# Patient Record
Sex: Female | Born: 1980
Health system: Southern US, Community
[De-identification: ages and names within clinical notes are randomized; demographics above are authoritative.]

## PROBLEM LIST (undated history)

## (undated) DIAGNOSIS — E282 Polycystic ovarian syndrome: Secondary | ICD-10-CM

## (undated) DIAGNOSIS — U071 COVID-19: Secondary | ICD-10-CM

---

## 2004-07-27 ENCOUNTER — Emergency Department (HOSPITAL_COMMUNITY): Admission: EM | Admit: 2004-07-27 | Discharge: 2004-07-28 | Payer: Self-pay | Admitting: Emergency Medicine

## 2019-05-02 DIAGNOSIS — F411 Generalized anxiety disorder: Secondary | ICD-10-CM | POA: Insufficient documentation

## 2019-05-02 DIAGNOSIS — E282 Polycystic ovarian syndrome: Secondary | ICD-10-CM | POA: Insufficient documentation

## 2020-05-31 ENCOUNTER — Telehealth: Payer: Self-pay | Admitting: Physician Assistant

## 2020-05-31 ENCOUNTER — Telehealth (HOSPITAL_COMMUNITY): Payer: Self-pay | Admitting: Family

## 2020-05-31 DIAGNOSIS — R69 Illness, unspecified: Secondary | ICD-10-CM

## 2020-05-31 NOTE — Telephone Encounter (Signed)
Called to discuss with Lorine Bears about Covid symptoms and potential candidacy for the use of casirivimab/imdevimab, a combination monoclonal antibody infusion for those with mild to moderate Covid symptoms and at a high risk of hospitalization.     Pt is qualified for this infusion at the infusion center due to co-morbid conditions and/or a member of an at-risk group, however unable to reach patient. Phone did not ring, immediately disconnected.   Novi Calia,NP

## 2020-05-31 NOTE — Telephone Encounter (Signed)
Called to Discuss with patient about Covid symptoms and the use of the monoclonal antibody infusion for those with mild to moderate Covid symptoms and at a high risk of hospitalization.     Pt appears to qualify for this infusion due to co-morbid conditions and/or a member of an at-risk group in accordance with the FDA Emergency Use Authorization.    Unable to reach pt. No voice mail.    

## 2020-06-01 DIAGNOSIS — U071 COVID-19: Secondary | ICD-10-CM | POA: Insufficient documentation

## 2020-06-02 ENCOUNTER — Telehealth: Payer: Self-pay | Admitting: Nurse Practitioner

## 2020-06-02 NOTE — Telephone Encounter (Signed)
Called to Discuss with patient about Covid symptoms and the use of the monoclonal antibody infusion for those with mild to moderate Covid symptoms and at a high risk of hospitalization.     Pt appears to qualify for this infusion due to co-morbid conditions and/or a member of an at-risk group in accordance with the FDA Emergency Use Authorization. BMI>25.    Unable to reach pt by multiple providers in attempt to set-up monoclonal infusion. Initial calls made on 05/21/20. No voicemail. Phone hands up after a few rings.   Willette Alma, NP [facility]  [phone]

## 2020-06-13 ENCOUNTER — Emergency Department: Admit: 2020-06-13 | Discharge status: Absent without leave | Payer: Self-pay

## 2020-06-13 ENCOUNTER — Encounter: Payer: Self-pay | Admitting: Emergency Medicine

## 2020-06-13 ENCOUNTER — Emergency Department (INDEPENDENT_AMBULATORY_CARE_PROVIDER_SITE_OTHER)
Admission: EM | Admit: 2020-06-13 | Discharge: 2020-06-13 | Disposition: A | Discharge status: Home / Self Care | Payer: BC Managed Care – PPO | Source: Home / Self Care

## 2020-06-13 ENCOUNTER — Other Ambulatory Visit: Payer: Self-pay

## 2020-06-13 DIAGNOSIS — L03211 Cellulitis of face: Secondary | ICD-10-CM | POA: Diagnosis not present

## 2020-06-13 DIAGNOSIS — H01001 Unspecified blepharitis right upper eyelid: Secondary | ICD-10-CM

## 2020-06-13 DIAGNOSIS — J0100 Acute maxillary sinusitis, unspecified: Secondary | ICD-10-CM

## 2020-06-13 HISTORY — DX: Polycystic ovarian syndrome: E28.2

## 2020-06-13 MED ORDER — ERYTHROMYCIN 5 MG/GM OP OINT: TOPICAL_OINTMENT | Freq: Four times a day (QID) | OPHTHALMIC | 0 refills | Status: AC

## 2020-06-13 MED ORDER — CLINDAMYCIN HCL 300 MG PO CAPS: 300.0000 mg | ORAL_CAPSULE | Freq: Three times a day (TID) | ORAL | 0 refills | Status: AC

## 2020-06-13 NOTE — Discharge Instructions (Signed)
  Please take antibiotics as prescribed and be sure to complete entire course even if you start to feel better to ensure infection does not come back.  Follow up in 3-4 days if not improving. Call 911 or go to the hospital right away if symptoms worsening- increased facial pain or swelling, eye pain, vision changes, dizziness/severe headache or other new concerning symptoms develop.

## 2020-06-13 NOTE — ED Provider Notes (Signed)
[facility]    CSN: [id number] Arrival date & time: 06/13/20  1504      History   Chief Complaint Chief Complaint  Patient presents with  . Facial Pain    HPI Stacy Jacobs is a 39 y.o. female.   HPI Stacy Jacobs is a 39 y.o. female presenting to UC with c/o intermittent facial swelling with sinus pressure and redness for about 3-4 days.  She was tx with the monoclonal antibody COVID infusion 06/02/20 after being dx with COVID on 05/28/20.  Symptoms of nasal congestion were improving until the last few days. She cannot see her PCP until 14 days past COVID dx, which would be next week. Denies fever, chills, n/v/d. No chest pain or SOB. No eye pain or vision changes.    Past Medical History:  Diagnosis Date  . PCOS (polycystic ovarian syndrome)     There are no problems to display for this patient.   Past Surgical History:  Procedure Laterality Date  . CESAREAN SECTION      OB History   No obstetric history on file.      Home Medications    Prior to Admission medications   Medication Sig Start Date End Date Taking? Authorizing Provider  metFORMIN (GLUCOPHAGE) 1000 MG tablet Take 1,000 mg by mouth daily with breakfast.   Yes [provider]  clindamycin (CLEOCIN) 300 MG capsule Take 1 capsule (300 mg total) by mouth 3 (three) times daily for 7 days. 06/13/20 06/20/20  Lurene Shadow, PA-C  erythromycin ophthalmic ointment Place into the right eye 4 (four) times daily for 5 days. Place a 1/2 inch ribbon of ointment into the lower eyelid. 06/13/20 06/18/20  Lurene Shadow, PA-C    Family History No family history on file.  Social History Social History   Tobacco Use  . Smoking status: Never Smoker  . Smokeless tobacco: Never Used  Substance Use Topics  . Alcohol use: Not on file  . Drug use: Not on file     Allergies   Septra [sulfamethoxazole-trimethoprim]   Review of Systems Review of Systems  Constitutional: Negative  for chills and fever.  HENT: Positive for congestion, facial swelling and sinus pressure. Negative for ear pain, sore throat, trouble swallowing and voice change.   Eyes: Positive for redness (Right upper eyelid). Negative for photophobia, pain, discharge, itching and visual disturbance.  Respiratory: Negative for cough and shortness of breath.   Cardiovascular: Negative for chest pain and palpitations.  Gastrointestinal: Negative for abdominal pain, diarrhea, nausea and vomiting.  Musculoskeletal: Negative for arthralgias, back pain and myalgias.  Skin: Negative for rash.  Neurological: Positive for headaches (mild frontal). Negative for dizziness and light-headedness.  All other systems reviewed and are negative.    Physical Exam Triage Vital Signs ED Triage Vitals  Enc Vitals Group     BP 06/13/20 1524 137/85     Pulse Rate 06/13/20 1524 83     Resp 06/13/20 1524 16     Temp 06/13/20 1524 97.9 F (36.6 C)     Temp Source 06/13/20 1524 Oral     SpO2 06/13/20 1524 96 %     Weight 06/13/20 1525 220 lb (99.8 kg)     Height 06/13/20 1525 5\' 3"  (1.6 m)     Head Circumference --      Peak Flow --      Pain Score 06/13/20 1525 2     Pain Loc --  Pain Edu? --      Excl. in GC? --    No data found.  Updated Vital Signs BP 137/85 (BP Location: Right Arm)   Pulse 83   Temp 97.9 F (36.6 C) (Oral)   Resp 16   Ht 5\' 3"  (1.6 m)   Wt 220 lb (99.8 kg)   LMP 05/19/2020 (Exact Date)   SpO2 96%   BMI 38.97 kg/m   Visual Acuity Right Eye Distance:   Left Eye Distance:   Bilateral Distance:    Right Eye Near:   Left Eye Near:    Bilateral Near:     Physical Exam Vitals and nursing note reviewed.  Constitutional:      General: She is not in acute distress.    Appearance: Normal appearance. She is well-developed. She is obese. She is not ill-appearing, toxic-appearing or diaphoretic.  HENT:     Head: Normocephalic and atraumatic.      Right Ear: Tympanic membrane and  ear canal normal.     Left Ear: Tympanic membrane and ear canal normal.     Nose:     Right Sinus: Maxillary sinus tenderness present. No frontal sinus tenderness.     Left Sinus: Maxillary sinus tenderness present. No frontal sinus tenderness.     Mouth/Throat:     Lips: Pink.     Mouth: Mucous membranes are moist.     Pharynx: Oropharynx is clear. Uvula midline. No pharyngeal swelling, oropharyngeal exudate, posterior oropharyngeal erythema or uvula swelling.  Eyes:     General:        Right eye: No discharge.     Extraocular Movements: Extraocular movements intact.     Conjunctiva/sclera: Conjunctivae normal.     Pupils: Pupils are equal, round, and reactive to light.   Cardiovascular:     Rate and Rhythm: Normal rate and regular rhythm.  Pulmonary:     Effort: Pulmonary effort is normal. No respiratory distress.     Breath sounds: Normal breath sounds. No stridor. No wheezing, rhonchi or rales.  Musculoskeletal:        General: Normal range of motion.     Cervical back: Normal range of motion and neck supple. No tenderness.  Lymphadenopathy:     Cervical: No cervical adenopathy.  Skin:    General: Skin is warm and dry.  Neurological:     Mental Status: She is alert and oriented to person, place, and time.  Psychiatric:        Behavior: Behavior normal.      UC Treatments / Results  Labs (all labs ordered are listed, but only abnormal results are displayed) Labs Reviewed - No data to display  EKG   Radiology No results found.  Procedures Procedures (including critical care time)  Medications Ordered in UC Medications - No data to display  Initial Impression / Assessment and Plan / UC Course  I have reviewed the triage vital signs and the nursing notes.  Pertinent labs & imaging results that were available during my care of the patient were reviewed by me and considered in my medical decision making (see chart for details).     Hx and exam c/w maxillary  sinusitis with mild blepharitis of Right upper eyelid and early facial cellulitis without definite periorbital cellulitis.  Rx: clindamycin, erythromycin eye ointment Encouraged f/u with PCP in 3-4 days. Discussed symptoms that warrant emergent care in the ED. AVS given  Final Clinical Impressions(s) / UC Diagnoses   Final diagnoses:  Facial cellulitis  Acute non-recurrent maxillary sinusitis  Blepharitis of right upper eyelid, unspecified type     Discharge Instructions      Please take antibiotics as prescribed and be sure to complete entire course even if you start to feel better to ensure infection does not come back.  Follow up in 3-4 days if not improving. Call 911 or go to the hospital right away if symptoms worsening- increased facial pain or swelling, eye pain, vision changes, dizziness/severe headache or other new concerning symptoms develop.    ED Prescriptions    Medication Sig Dispense Auth. Provider   clindamycin (CLEOCIN) 300 MG capsule Take 1 capsule (300 mg total) by mouth 3 (three) times daily for 7 days. 21 capsule Waylan Rocher O, PA-C   erythromycin ophthalmic ointment Place into the right eye 4 (four) times daily for 5 days. Place a 1/2 inch ribbon of ointment into the lower eyelid. 3.5 g Lurene Shadow, PA-C     PDMP not reviewed this encounter.   Lurene Shadow, [place] 06/13/20 1604

## 2020-06-13 NOTE — ED Triage Notes (Signed)
Patient here for sinus congestion and periorbital edema and pressure; wonders if she has sinus infection; tested positive for covid 10/28/ 21 and has had intermittent congestion problems since then; relatively mild case covid but did take monoclonal treatment.

## 2020-06-28 ENCOUNTER — Emergency Department: Admit: 2020-06-28 | Discharge status: Absent without leave | Payer: Self-pay

## 2020-06-28 ENCOUNTER — Encounter: Payer: Self-pay | Admitting: Emergency Medicine

## 2020-06-28 ENCOUNTER — Other Ambulatory Visit: Payer: Self-pay

## 2020-06-28 ENCOUNTER — Emergency Department (INDEPENDENT_AMBULATORY_CARE_PROVIDER_SITE_OTHER)
Admission: EM | Admit: 2020-06-28 | Discharge: 2020-06-28 | Disposition: A | Discharge status: Home / Self Care | Payer: BC Managed Care – PPO | Source: Home / Self Care

## 2020-06-28 ENCOUNTER — Ambulatory Visit (HOSPITAL_BASED_OUTPATIENT_CLINIC_OR_DEPARTMENT_OTHER)
Admission: RE | Admit: 2020-06-28 | Discharge: 2020-06-28 | Disposition: A | Discharge status: Home / Self Care | Payer: BC Managed Care – PPO | Source: Ambulatory Visit | Attending: Emergency Medicine | Admitting: Emergency Medicine

## 2020-06-28 DIAGNOSIS — M79605 Pain in left leg: Secondary | ICD-10-CM | POA: Insufficient documentation

## 2020-06-28 DIAGNOSIS — R252 Cramp and spasm: Secondary | ICD-10-CM | POA: Diagnosis not present

## 2020-06-28 DIAGNOSIS — M7989 Other specified soft tissue disorders: Secondary | ICD-10-CM | POA: Diagnosis not present

## 2020-06-28 NOTE — ED Triage Notes (Addendum)
Positive for covid 05/28/20 - had monoclonal infusion Pt unsure of when left leg pain started - noticed swelling to left calf yesterday morning Thinks pain started on Friday or Saturday  Denies redness or warmth  No relief w/ biofreeze, drinking pickle juice or massage No COVID vaccine

## 2020-06-28 NOTE — ED Provider Notes (Signed)
[name]    CSN: [id number] Arrival date & time: 06/28/20  1234      History   Chief Complaint Chief Complaint  Patient presents with  . Leg Swelling    left    HPI Stacy Jacobs is a 39 y.o. female.   HPI  Stacy Jacobs is a 39 y.o. female presenting to UC with c/o 2 days of left lower leg pain and cramping with mild swelling yesterday.  No known injury. Pt tested positive for COVID at home on 05/28/20, she was not vaccinated for COVID but did receive the monoclonal antibody infusion tx.  Pt's URI symptoms resolved. Denies cough, congestion, chest pain or SOB. No hx of clots.    Past Medical History:  Diagnosis Date  . PCOS (polycystic ovarian syndrome)     Patient Active Problem List   Diagnosis Date Noted  . COVID-19 06/01/2020  . GAD (generalized anxiety disorder) 05/02/2019  . PCOS (polycystic ovarian syndrome) 05/02/2019    Past Surgical History:  Procedure Laterality Date  . CESAREAN SECTION      OB History   No obstetric history on file.      Home Medications    Prior to Admission medications   Medication Sig Start Date End Date Taking? Authorizing Provider  Bioflavonoid Products (QUERCETIN COMPLEX IMMUNE PO) Take by mouth.   Yes [provider]  Cholecalciferol 25 MCG (1000 UT) tablet Take by mouth.   Yes [provider]  metFORMIN (GLUCOPHAGE) 1000 MG tablet Take 1,000 mg by mouth daily with breakfast.    [provider]    Family History Family History  Problem Relation Age of Onset  . Hypertension Mother   . Glaucoma Mother     Social History Social History   Tobacco Use  . Smoking status: Never Smoker  . Smokeless tobacco: Never Used  Substance Use Topics  . Alcohol use: Not on file  . Drug use: Not on file     Allergies   Septra [sulfamethoxazole-trimethoprim]   Review of Systems Review of Systems  Constitutional: Negative for chills and fever.  Musculoskeletal: Positive  for myalgias. Negative for arthralgias and gait problem.  Skin: Negative for color change and wound.     Physical Exam Triage Vital Signs ED Triage Vitals  Enc Vitals Group     BP 06/28/20 1256 117/83     Pulse Rate 06/28/20 1256 (!) 105     Resp 06/28/20 1256 17     Temp 06/28/20 1256 98.7 F (37.1 C)     Temp Source 06/28/20 1256 Oral     SpO2 06/28/20 1256 95 %     Weight 06/28/20 1258 220 lb (99.8 kg)     Height 06/28/20 1258 5\' 3"  (1.6 m)     Head Circumference --      Peak Flow --      Pain Score 06/28/20 1257 2     Pain Loc --      Pain Edu? --      Excl. in GC? --    No data found.  Updated Vital Signs BP 117/83 (BP Location: Right Arm)   Pulse (!) 105   Temp 98.7 F (37.1 C) (Oral)   Resp 17   Ht 5\' 3"  (1.6 m)   Wt 220 lb (99.8 kg)   LMP 06/26/2020 (Exact Date)   SpO2 95%   BMI 38.97 kg/m   Visual Acuity Right Eye Distance:   Left Eye Distance:  Bilateral Distance:    Right Eye Near:   Left Eye Near:    Bilateral Near:     Physical Exam Vitals and nursing note reviewed.  Constitutional:      Appearance: Normal appearance. She is well-developed. She is obese.  HENT:     Head: Normocephalic and atraumatic.  Cardiovascular:     Rate and Rhythm: Normal rate and regular rhythm.  Pulmonary:     Effort: Pulmonary effort is normal. No respiratory distress.     Breath sounds: Normal breath sounds.  Musculoskeletal:        General: Swelling and tenderness present. Normal range of motion.     Cervical back: Normal range of motion.     Comments: Left lower leg: mild edema, mild tenderness to calf. Calf is soft. Full ROM knee and ankle.  Skin:    General: Skin is warm and dry.     Findings: No erythema or rash.  Neurological:     Mental Status: She is alert and oriented to person, place, and time.  Psychiatric:        Behavior: Behavior normal.      UC Treatments / Results  Labs (all labs ordered are listed, but only abnormal results are  displayed) Labs Reviewed - No data to display  EKG   Radiology US Venous Img Lower Unilateral Left (DVT)  Result Date: 06/28/2020 CLINICAL DATA:  Lower extremity pain and edema EXAM: LEFT LOWER EXTREMITY VENOUS DUPLEX ULTRASOUND TECHNIQUE: Gray-scale sonography with graded compression, as well as color Doppler and duplex ultrasound were performed to evaluate the left lower extremity deep venous system from the level of the common femoral vein and including the common femoral, femoral, profunda femoral, popliteal and calf veins including the posterior tibial, peroneal and gastrocnemius veins when visible. The superficial great saphenous vein was also interrogated. Spectral Doppler was utilized to evaluate flow at rest and with distal augmentation maneuvers in the common femoral, femoral and popliteal veins. COMPARISON:  None. FINDINGS: Contralateral Common Femoral Vein: Respiratory phasicity is normal and symmetric with the symptomatic side. No evidence of thrombus. Normal compressibility. Common Femoral Vein: No evidence of thrombus. Normal compressibility, respiratory phasicity and response to augmentation. Saphenofemoral Junction: No evidence of thrombus. Normal compressibility and flow on color Doppler imaging. Profunda Femoral Vein: No evidence of thrombus. Normal compressibility and flow on color Doppler imaging. Femoral Vein: No evidence of thrombus. Normal compressibility, respiratory phasicity and response to augmentation. Popliteal Vein: No evidence of thrombus. Normal compressibility, respiratory phasicity and response to augmentation. Calf Veins: No evidence of thrombus. Normal compressibility and flow on color Doppler imaging. Superficial Great Saphenous Vein: No evidence of thrombus. Normal compressibility. Venous Reflux:  None. Other Findings:  None. IMPRESSION: No evidence of deep venous thrombosis in the left lower extremity. Right common femoral vein also patent. Electronically Signed    By: Bretta Bang III M.D.   On: 06/28/2020 15:08    Procedures Procedures (including critical care time)  Medications Ordered in UC Medications - No data to display  Initial Impression / Assessment and Plan / UC Course  I have reviewed the triage vital signs and the nursing notes.  Pertinent labs & imaging results that were available during my care of the patient were reviewed by me and considered in my medical decision making (see chart for details).    Due to recent COVID infection, increased risk of clotting and unilateral leg cramping with swelling, concern for DVT Pt sent to [facility] for  venous US 3:29 PM discussed normal venous US with pt. Encouraged to treat as muscle strain and to f/u PCP later this week.    Final Clinical Impressions(s) / UC Diagnoses   Final diagnoses:  Leg cramp  Left leg pain     Discharge Instructions      An ultrasound has been ordered for you at [facility] to make sure you do not have a blood clot in your leg. Please go into the emergency department entracance and let them know you are there for outpatient imaging. They will be expecting you.  Wait in the waiting room to be notified of the results but if you have to leave, be sure to leave a good call back number and be read to answer your phone in case medication is indicated. If no clot, you can treat your pain by gentle massage, alternating cool and warm compresses, tylenol and motrin. Follow up with family medicine later in the week if needed.      ED Prescriptions    None     PDMP not reviewed this encounter.   Lurene Shadow, [place] 06/28/20 1529

## 2020-06-28 NOTE — Discharge Instructions (Addendum)
  An ultrasound has been ordered for you at [facility] to make sure you do not have a blood clot in your leg. Please go into the emergency department entracance and let them know you are there for outpatient imaging. They will be expecting you.  Wait in the waiting room to be notified of the results but if you have to leave, be sure to leave a good call back number and be read to answer your phone in case medication is indicated. If no clot, you can treat your pain by gentle massage, alternating cool and warm compresses, tylenol and motrin. Follow up with family medicine later in the week if needed.

## 2020-12-20 ENCOUNTER — Emergency Department
Admission: EM | Admit: 2020-12-20 | Discharge: 2020-12-20 | Disposition: A | Discharge status: Home / Self Care | Payer: BC Managed Care – PPO | Source: Home / Self Care

## 2020-12-20 ENCOUNTER — Other Ambulatory Visit: Payer: Self-pay

## 2020-12-20 ENCOUNTER — Encounter: Payer: Self-pay | Admitting: Emergency Medicine

## 2020-12-20 DIAGNOSIS — U071 COVID-19: Secondary | ICD-10-CM | POA: Diagnosis not present

## 2020-12-20 DIAGNOSIS — H1031 Unspecified acute conjunctivitis, right eye: Secondary | ICD-10-CM

## 2020-12-20 HISTORY — DX: COVID-19: U07.1

## 2020-12-20 MED ORDER — CIPROFLOXACIN HCL 0.3 % OP SOLN: 1.0000 [drp] | OPHTHALMIC | 0 refills | Status: AC

## 2020-12-20 NOTE — ED Provider Notes (Signed)
[name]    CSN: [id number] Arrival date & time: 12/20/20  1347      History   Chief Complaint Chief Complaint  Patient presents with  . Covid Positive  . Eye Problem  . Back Pain    HPI Stacy Jacobs is a 40 y.o. female.   Patient presents with concerns of possible right eye infection. The patient started feeling unwell Friday with headache, body aches, congestion, and cough. She subsequently tested positive for COVID. She denies fever or difficulty breathing. She reports some pain in the middle of her back that she attributes to coughing. She states she had COVID back in November and developed a right eye infection that took weeks to clear. She noticed today that she is having purulent drainage from her right eye and irritation, similar to what she had before. The patient states she did a telehealth visit but they told her she needed to be seen in person to get her lungs listened to. She has taken some OTC medicine as needed for symptoms as well as washing her right eye.   The history is provided by the patient.    Past Medical History:  Diagnosis Date  . COVID    twice  . PCOS (polycystic ovarian syndrome)     Patient Active Problem List   Diagnosis Date Noted  . COVID-19 06/01/2020  . GAD (generalized anxiety disorder) 05/02/2019  . PCOS (polycystic ovarian syndrome) 05/02/2019    Past Surgical History:  Procedure Laterality Date  . CESAREAN SECTION      OB History   No obstetric history on file.      Home Medications    Prior to Admission medications   Medication Sig Start Date End Date Taking? Authorizing Provider  ciprofloxacin (CILOXAN) 0.3 % ophthalmic solution Place 1 drop into the right eye every 4 (four) hours while awake for 5 days. 12/20/20 12/25/20 Yes Yashua Bracco L, PA  Bioflavonoid Products (QUERCETIN COMPLEX IMMUNE PO) Take by mouth.    [provider]  Cholecalciferol 25 MCG (1000 UT) tablet Take by mouth.     [provider]  metFORMIN (GLUCOPHAGE) 1000 MG tablet Take 1,000 mg by mouth daily with breakfast.    [provider]    Family History Family History  Problem Relation Age of Onset  . Hypertension Mother   . Glaucoma Mother     Social History Social History   Tobacco Use  . Smoking status: Never Smoker  . Smokeless tobacco: Never Used     Allergies   Septra [sulfamethoxazole-trimethoprim]   Review of Systems Review of Systems  Constitutional: Positive for fatigue. Negative for fever.  HENT: Positive for congestion. Negative for ear pain, rhinorrhea and sore throat.   Eyes: Positive for discharge. Negative for redness and visual disturbance.  Respiratory: Positive for cough. Negative for chest tightness and shortness of breath.   Cardiovascular: Negative for chest pain.  Gastrointestinal: Negative for diarrhea, nausea and vomiting.  Musculoskeletal: Positive for myalgias.  Skin: Negative for rash.  Neurological: Positive for headaches. Negative for dizziness.     Physical Exam Triage Vital Signs ED Triage Vitals  Enc Vitals Group     BP 12/20/20 1616 103/74     Pulse Rate 12/20/20 1616 (!) 112     Resp 12/20/20 1616 18     Temp 12/20/20 1616 98.4 F (36.9 C)     Temp Source 12/20/20 1616 Oral     SpO2 12/20/20 1616 94 %  Weight 12/20/20 1454 225 lb (102.1 kg)     Height 12/20/20 1454 5' (1.524 m)     Head Circumference --      Peak Flow --      Pain Score 12/20/20 1454 2     Pain Loc --      Pain Edu? --      Excl. in GC? --    No data found.  Updated Vital Signs BP 103/74 (BP Location: Right Arm)   Pulse (!) 112   Temp 98.4 F (36.9 C) (Oral)   Resp 18   Ht 5' (1.524 m)   Wt 225 lb (102.1 kg)   LMP 12/14/2020 (Exact Date)   SpO2 94%   BMI 43.94 kg/m   Visual Acuity Right Eye Distance:   Left Eye Distance:   Bilateral Distance:    Right Eye Near:   Left Eye Near:    Bilateral Near:     Physical Exam Vitals and  nursing note reviewed.  Constitutional:      General: She is not in acute distress. HENT:     Head: Normocephalic.     Mouth/Throat:     Mouth: Mucous membranes are moist.  Eyes:     General:        Right eye: Discharge present.        Left eye: No discharge.     Extraocular Movements: Extraocular movements intact.     Conjunctiva/sclera: Conjunctivae normal.     Pupils: Pupils are equal, round, and reactive to light.  Cardiovascular:     Rate and Rhythm: Normal rate and regular rhythm.     Heart sounds: Normal heart sounds.  Pulmonary:     Effort: Pulmonary effort is normal.     Breath sounds: Normal breath sounds.  Musculoskeletal:     Cervical back: Normal range of motion.  Lymphadenopathy:     Cervical: No cervical adenopathy.  Skin:    General: Skin is warm.     Findings: No rash.  Neurological:     Mental Status: She is alert.  Psychiatric:        Mood and Affect: Mood normal.      UC Treatments / Results  Labs (all labs ordered are listed, but only abnormal results are displayed) Labs Reviewed - No data to display  EKG   Radiology No results found.  Procedures Procedures (including critical care time)  Medications Ordered in UC Medications - No data to display  Initial Impression / Assessment and Plan / UC Course  I have reviewed the triage vital signs and the nursing notes.  Pertinent labs & imaging results that were available during my care of the patient were reviewed by me and considered in my medical decision making (see chart for details).     COVID, suspect viral conjunctivitis due to COVID but given hx of similar that required antibiotics will start antibiotic drops. ER precautions given.   E/M: 1 acute illness with systemic symptoms, no data, moderate risk due to prescription management  Final Clinical Impressions(s) / UC Diagnoses   Final diagnoses:  COVID  Acute conjunctivitis of right eye, unspecified acute conjunctivitis type      Discharge Instructions     Use antibiotic drops as prescribed to treat early eye infection. Follow up with eye doctor if no improvement in a few days. Go to the ER if develop difficulty breathing, chest pain, or other new/severe symptoms.     ED Prescriptions    Medication  Sig Dispense Auth. Provider   ciprofloxacin (CILOXAN) 0.3 % ophthalmic solution Place 1 drop into the right eye every 4 (four) hours while awake for 5 days. 5 mL Vallery Sa, Angelos Wasco L, PA     PDMP not reviewed this encounter.   Estanislado Pandy, [place] 12/20/20 1705

## 2020-12-20 NOTE — Discharge Instructions (Signed)
Use antibiotic drops as prescribed to treat early eye infection. Follow up with eye doctor if no improvement in a few days. Go to the ER if develop difficulty breathing, chest pain, or other new/severe symptoms.

## 2020-12-20 NOTE — ED Triage Notes (Signed)
Did have covid in 10/22.

## 2020-12-20 NOTE — ED Triage Notes (Signed)
Patient here for covid problems with test positive 3 days ago; several co-workers have covid and she has not been vaccinated; having some congestion, back pain and right eye drainage.

## 2022-03-01 IMAGING — US US EXTREM LOW VENOUS*L*
1 series · 13 of 24 positions shown · non-contrast
Comparison: None.

CLINICAL DATA: Lower extremity pain and edema

EXAM:
LEFT LOWER EXTREMITY VENOUS DUPLEX ULTRASOUND
TECHNIQUE: Gray-scale sonography with graded compression, as well as color
Doppler and duplex ultrasound were performed to evaluate the left
lower extremity deep venous system from the level of the common
femoral vein and including the common femoral, femoral, profunda
femoral, popliteal and calf veins including the posterior tibial,
peroneal and gastrocnemius veins when visible. The superficial great
saphenous vein was also interrogated. Spectral Doppler was utilized
to evaluate flow at rest and with distal augmentation maneuvers in
the common femoral, femoral and popliteal veins.

[Series 1: us extrem low venous*left* · 13 of 31 slices shown]
[im 1/31]
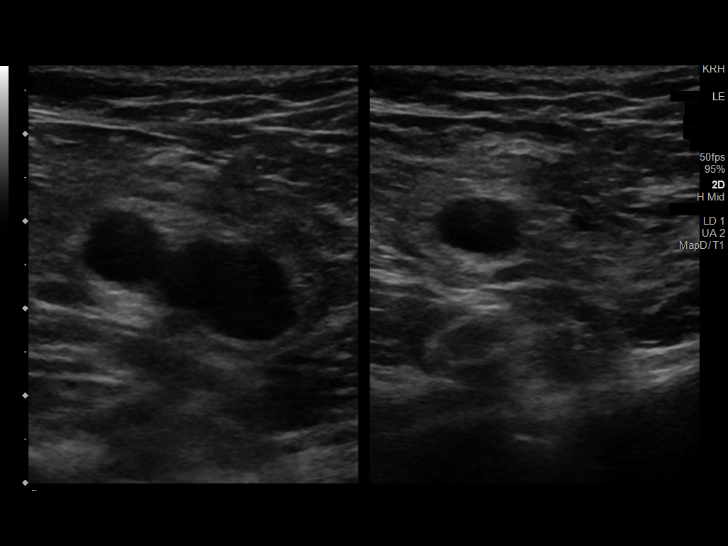
[im 3/31]
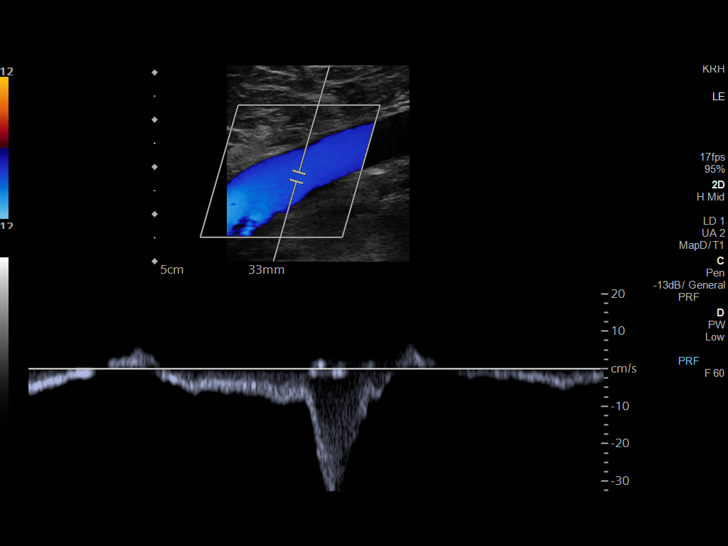
[im 6/31]
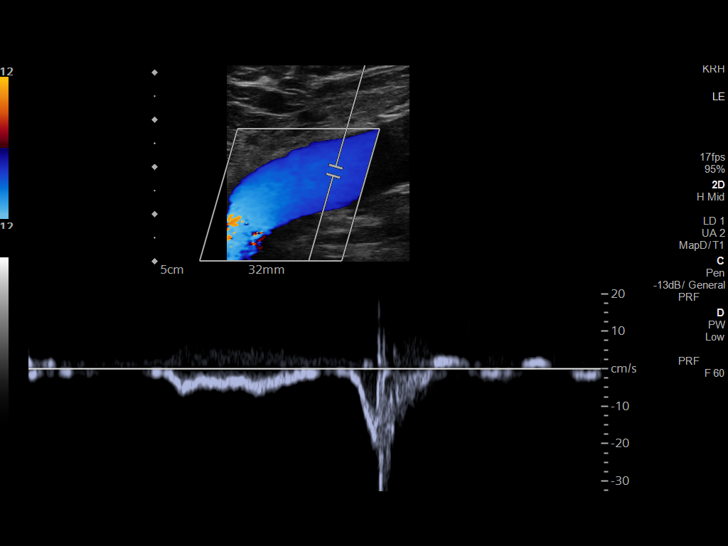
[im 8/31]
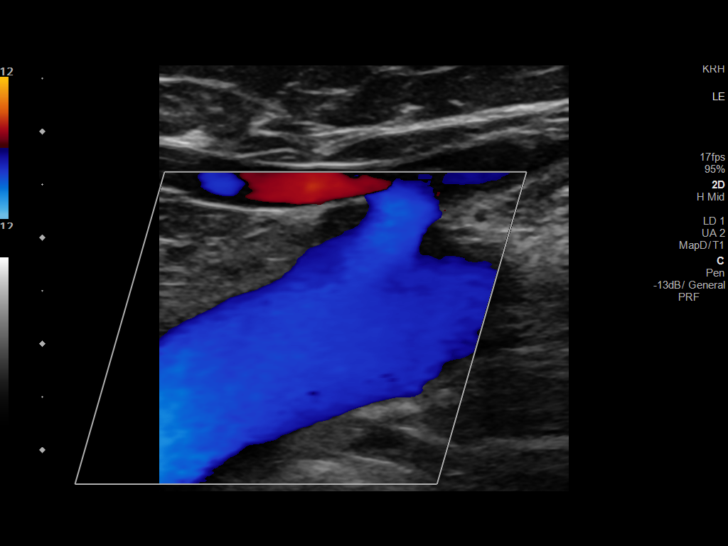
[im 11/31]
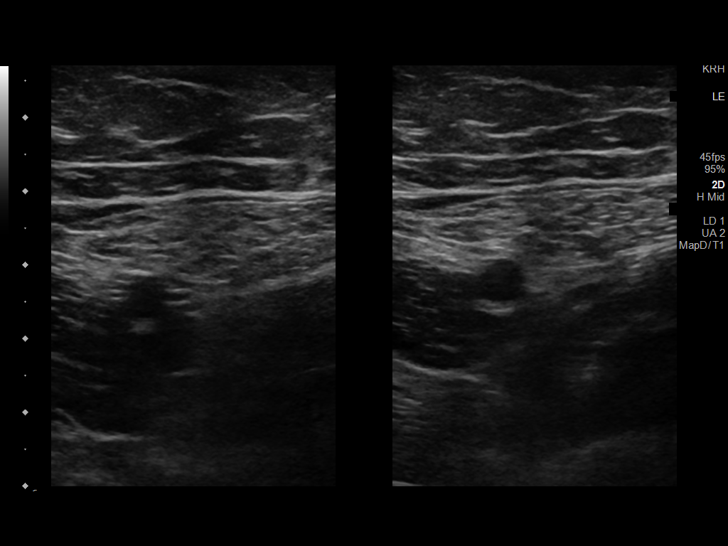
[im 14/31]
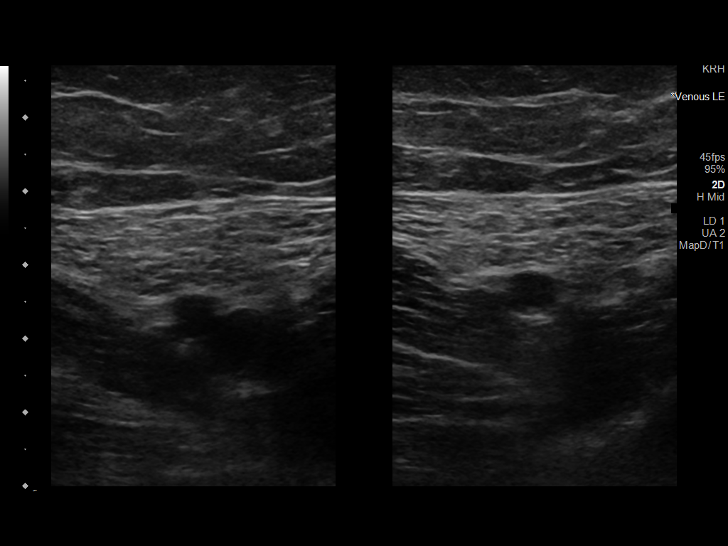
[im 16/31]
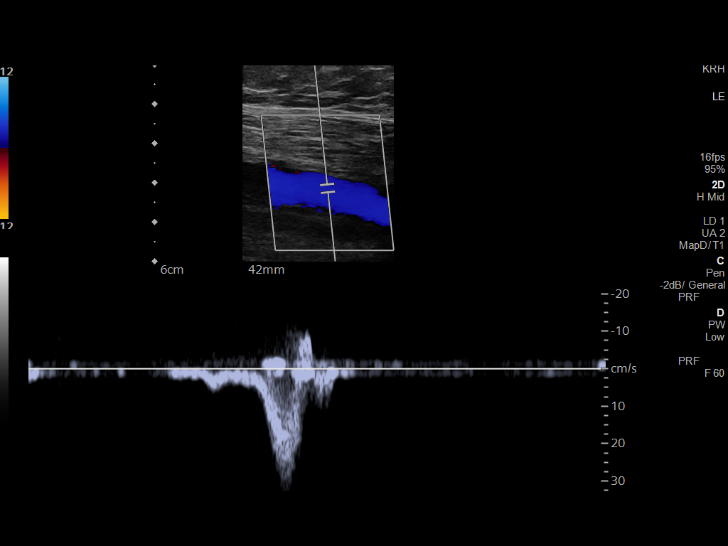
[im 17/31]
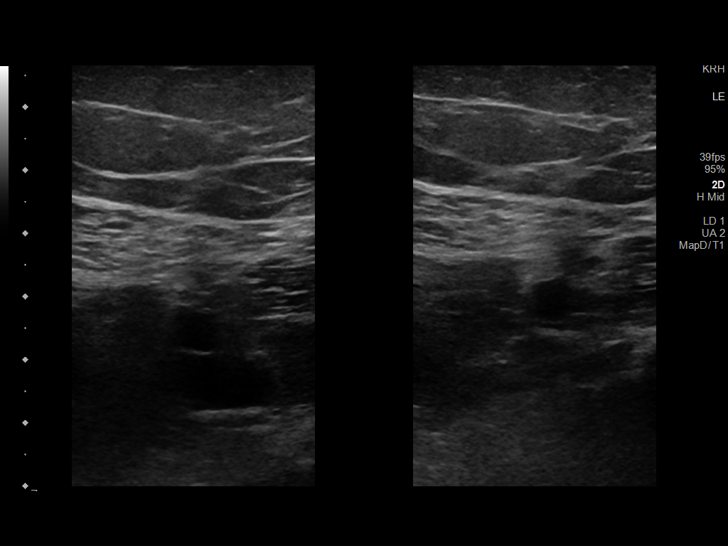
[im 20/31]
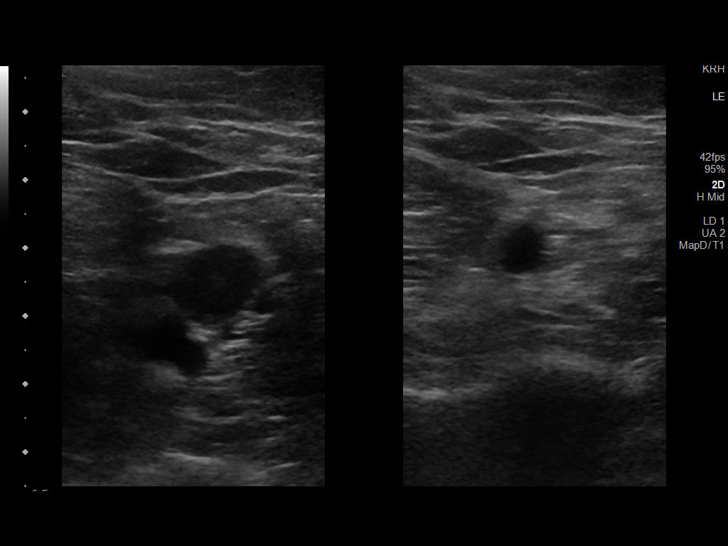
[im 23/31]
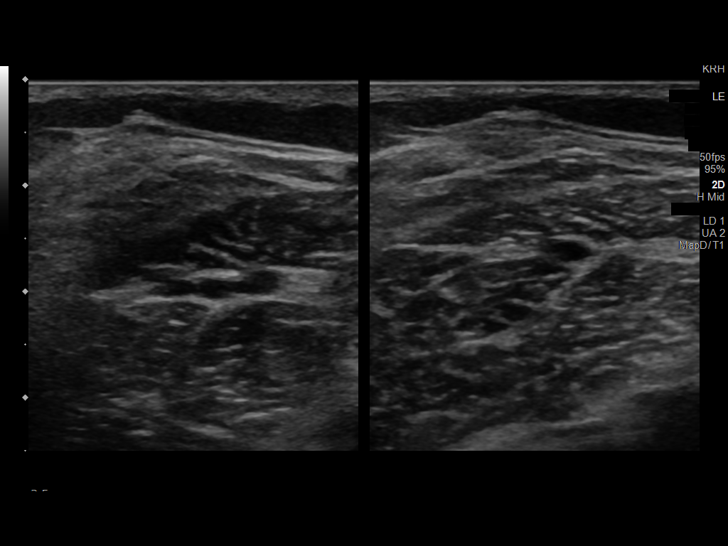
[im 25/31]
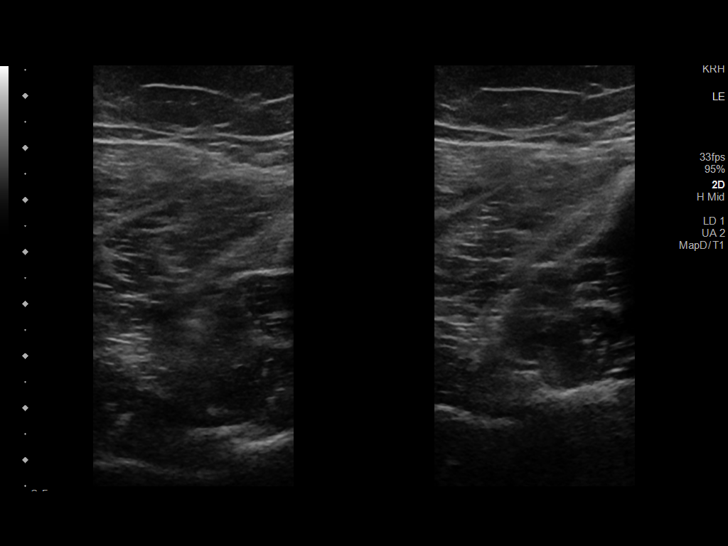
[im 28/31]
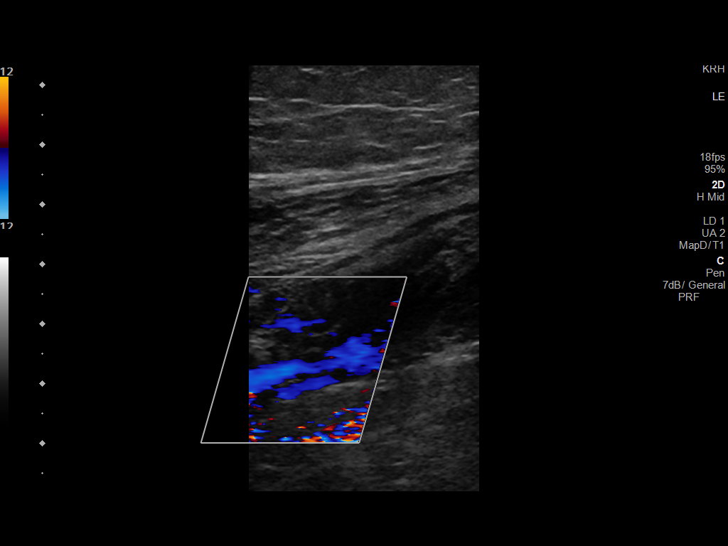
[im 31/31]
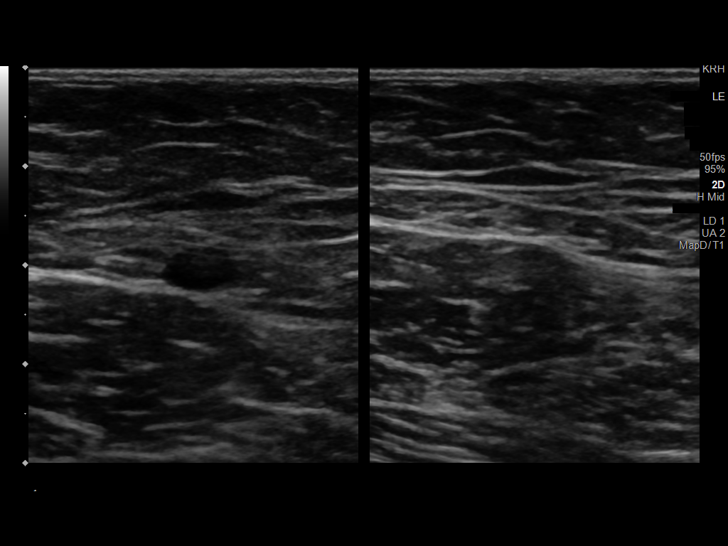

[13 of 24 positions shown; findings below may reference images not displayed]

FINDINGS: Contralateral Common Femoral Vein: Respiratory phasicity is normal
and symmetric with the symptomatic side. No evidence of thrombus.
Normal compressibility.

Common Femoral Vein: No evidence of thrombus. Normal
compressibility, respiratory phasicity and response to augmentation.

Saphenofemoral Junction: No evidence of thrombus. Normal
compressibility and flow on color Doppler imaging.

Profunda Femoral Vein: No evidence of thrombus. Normal
compressibility and flow on color Doppler imaging.

Femoral Vein: No evidence of thrombus. Normal compressibility,
respiratory phasicity and response to augmentation.

Popliteal Vein: No evidence of thrombus. Normal compressibility,
respiratory phasicity and response to augmentation.

Calf Veins: No evidence of thrombus. Normal compressibility and flow
on color Doppler imaging.

Superficial Great Saphenous Vein: No evidence of thrombus. Normal
compressibility.

Venous Reflux:  None.

Other Findings:  None.
IMPRESSION: No evidence of deep venous thrombosis in the left lower extremity.
Right common femoral vein also patent.
